# Patient Record
Sex: Female | Born: 2005 | Race: Black or African American | Hispanic: No | Marital: Single | Smoking: Never smoker
Health system: Southern US, Community
[De-identification: ages and names within clinical notes are randomized; demographics above are authoritative.]

---
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
## 2024-01-13 ENCOUNTER — Emergency Department (HOSPITAL_BASED_OUTPATIENT_CLINIC_OR_DEPARTMENT_OTHER)
Admission: EM | Admit: 2024-01-13 | Discharge: 2024-01-13 | Disposition: A | Discharge status: Home / Self Care | Payer: Self-pay | Attending: Emergency Medicine | Admitting: Emergency Medicine

## 2024-01-13 ENCOUNTER — Encounter (HOSPITAL_BASED_OUTPATIENT_CLINIC_OR_DEPARTMENT_OTHER): Payer: Self-pay | Admitting: Emergency Medicine

## 2024-01-13 ENCOUNTER — Other Ambulatory Visit: Payer: Self-pay

## 2024-01-13 ENCOUNTER — Emergency Department (HOSPITAL_BASED_OUTPATIENT_CLINIC_OR_DEPARTMENT_OTHER): Payer: Self-pay

## 2024-01-13 DIAGNOSIS — Y92410 Unspecified street and highway as the place of occurrence of the external cause: Secondary | ICD-10-CM | POA: Diagnosis not present

## 2024-01-13 DIAGNOSIS — M79642 Pain in left hand: Secondary | ICD-10-CM | POA: Insufficient documentation
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
